# Patient Record
Sex: Male | Born: 2000 | Hispanic: No | Marital: Single | Smoking: Current some day smoker
Health system: Southern US, Community
[De-identification: ages and names within clinical notes are randomized; demographics above are authoritative.]

---
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
## 2020-01-25 ENCOUNTER — Encounter: Payer: Self-pay | Admitting: Pediatrics

## 2020-01-25 ENCOUNTER — Ambulatory Visit (INDEPENDENT_AMBULATORY_CARE_PROVIDER_SITE_OTHER): Payer: Medicaid Other | Admitting: Pediatrics

## 2020-01-25 ENCOUNTER — Other Ambulatory Visit: Payer: Self-pay

## 2020-01-25 VITALS — BP 124/83 | HR 73 | Ht 68.0 in | Wt 218.2 lb

## 2020-01-25 DIAGNOSIS — R05 Cough: Secondary | ICD-10-CM | POA: Diagnosis not present

## 2020-01-25 DIAGNOSIS — Z20822 Contact with and (suspected) exposure to covid-19: Secondary | ICD-10-CM

## 2020-01-25 DIAGNOSIS — J069 Acute upper respiratory infection, unspecified: Secondary | ICD-10-CM

## 2020-01-25 DIAGNOSIS — F17290 Nicotine dependence, other tobacco product, uncomplicated: Secondary | ICD-10-CM | POA: Diagnosis not present

## 2020-01-25 DIAGNOSIS — R059 Cough, unspecified: Secondary | ICD-10-CM

## 2020-01-25 DIAGNOSIS — F17200 Nicotine dependence, unspecified, uncomplicated: Secondary | ICD-10-CM

## 2020-01-25 DIAGNOSIS — Z03818 Encounter for observation for suspected exposure to other biological agents ruled out: Secondary | ICD-10-CM

## 2020-01-25 DIAGNOSIS — R112 Nausea with vomiting, unspecified: Secondary | ICD-10-CM | POA: Diagnosis not present

## 2020-01-25 DIAGNOSIS — J029 Acute pharyngitis, unspecified: Secondary | ICD-10-CM | POA: Diagnosis not present

## 2020-01-25 LAB — POC SOFIA SARS ANTIGEN FIA: SARS:: NEGATIVE

## 2020-01-25 LAB — POCT RAPID STREP A (OFFICE): Rapid Strep A Screen: NEGATIVE

## 2020-01-25 NOTE — Progress Notes (Signed)
Name: Justin Yoder Age: 19 y.o. Sex: male DOB: November 17, 2000 MRN: [id number] Date of office visit: 01/25/2020  Chief Complaint  Patient presents with  . Emesis  . Sore Throat  . Cough    accompanied by mom Inetta Fermo, who is the primary historian.    HPI:  This is a 19 y.o. old patient who presents with a sore throat which started when he woke up two days ago.  This was followed by nonbilious, nonbloody vomiting the following day. He vomited once in the morning, then three more times throughout the day.  He also has associated symptoms of nasal congestion and a severe, productive cough which started yesterday. He denies any abdominal pain, diarrhea, fever, or constipation.  He has two siblings who presented with similar symptoms two weeks ago.  The patient smokes cigars.  History reviewed. No pertinent past medical history.  History reviewed. No pertinent surgical history.   History reviewed. No pertinent family history.  Outpatient Encounter Medications as of 01/25/2020  Medication Sig  . [DISCONTINUED] acetaminophen (TYLENOL 8 HOUR) 650 MG CR tablet Take 1 tablet (650 mg total) by mouth every 8 (eight) hours.  . [DISCONTINUED] fluticasone (FLONASE) 50 MCG/ACT nasal spray Place 2 sprays into both nostrils daily.  . [DISCONTINUED] Lidocaine 4 % PTCH Apply 1 patch topically 2 (two) times daily.  . [DISCONTINUED] methocarbamol (ROBAXIN) 500 MG tablet Take 1 tablet (500 mg total) by mouth 2 (two) times daily.  . [DISCONTINUED] naproxen (NAPROSYN) 500 MG tablet Take 1 tablet (500 mg total) by mouth 2 (two) times daily.   No facility-administered encounter medications on file as of 01/25/2020.     ALLERGIES:  No Known Allergies    OBJECTIVE:  VITALS: Blood pressure 124/83, pulse 73, height 5\' 8"  (1.727 m), weight 218 lb 3.2 oz (99 kg), SpO2 99 %.   Body mass index is 33.18 kg/m.  98 %ile (Z= 2.06) based on CDC (Boys, 2-20 Years) BMI-for-age based on BMI available as of  01/25/2020.  Wt Readings from Last 3 Encounters:  01/25/20 218 lb 3.2 oz (99 kg) (97 %, Z= 1.85)*  11/27/18 210 lb (95.3 kg) (96 %, Z= 1.78)*  07/16/17 185 lb (83.9 kg) (92 %, Z= 1.43)*   * Growth percentiles are based on CDC (Boys, 2-20 Years) data.   Ht Readings from Last 3 Encounters:  01/25/20 5\' 8"  (1.727 m) (29 %, Z= -0.56)*  11/27/18 5\' 10"  (1.778 m) (58 %, Z= 0.21)*  07/16/17 5\' 9"  (1.753 m) (51 %, Z= 0.02)*   * Growth percentiles are based on CDC (Boys, 2-20 Years) data.     PHYSICAL EXAM:  General: The patient appears awake, alert, and in no acute distress.  Head: Head is atraumatic/normocephalic.  Ears: TMs are translucent bilaterally without erythema or bulging.  Eyes: No scleral icterus.  No conjunctival injection.  Nose: Nasal congestion is present with clear rhinorrhea noted.  Mouth/Throat: Mouth is moist.  Throat with erythema over the palatoglossal arches.  Neck: Supple without adenopathy.  Chest: Good expansion, symmetric, no deformities noted.  Heart: Regular rate with normal S1-S2.  Lungs: Lungs are coarse but clear to auscultation bilaterally without wheezes or crackles.  No respiratory distress, work of breathing, or tachypnea noted.  Abdomen: Soft, nontender, nondistended with normal active bowel sounds.   No masses palpated.  No organomegaly noted.  Skin: No rashes noted.  Extremities/Back: Full range of motion with no deficits noted.  Neurologic exam: Musculoskeletal exam appropriate for age, normal strength,  and tone.   IN-HOUSE LABORATORY RESULTS: Results for orders placed or performed in visit on 01/25/20  POC SOFIA Antigen FIA  Result Value Ref Range   SARS: Negative Negative  POCT rapid strep A  Result Value Ref Range   Rapid Strep A Screen Negative Negative     ASSESSMENT/PLAN:  1. Viral pharyngitis Patient has a sore throat caused by virus. The patient will be contagious for the next several days. Soft mechanical diet may be  instituted. This includes things from dairy including milkshakes, ice cream, and cold milk. Push fluids. Any problems call back or return to office. Tylenol or Motrin may be used as needed for pain or fever per directions on the bottle. Rest is critically important to enhance the healing process and is encouraged by limiting activities.  - POCT rapid strep A  2. Viral upper respiratory infection Discussed this patient has a viral upper respiratory infection.  Nasal saline may be used for congestion and to thin the secretions for easier mobilization of the secretions. A humidifier may be used. Increase the amount of fluids the child is taking in to improve hydration. Tylenol may be used as directed on the bottle. Rest is critically important to enhance the healing process and is encouraged by limiting activities.  - POC SOFIA Antigen FIA  3. Cough Cough is a protective mechanism to clear airway secretions. Do not suppress a productive cough.  Increasing fluid intake will help keep the patient hydrated, therefore making the cough more productive and subsequently helpful. Running a humidifier helps increase water in the environment also making the cough more productive. If the child develops respiratory distress, increased work of breathing, retractions(sucking in the ribs to breathe), or increased respiratory rate, return to the office or ER.  4. Non-intractable vomiting with nausea, unspecified vomiting type Discussed vomiting is a nonspecific symptom that may have many different causes.  This 19-year-old's cause may be viral or many other causes. Discussed about small quantities of fluids frequently (ORT).  Avoid red beverages, juice, Powerade, Pedialyte, and caffeine.  Gatorade, water, or milk may be given.  Monitor urine output for hydration status.  If the child develops dehydration, return to office or ER.  5. Smoking Discussed with the patient about smoking.  Discussed about the side effects and  problems with smoking, particularly smoking cigars.  Counseling was performed x10 minutes.  6. Lab test negative for COVID-19 virus Discussed this patient has tested negative for COVID-19.  However, discussed about testing done and the limitations of the testing.  The testing done in this office is a FIA antigen test, not PCR.  The specificity is 100%, but the sensitivity is 95.2%.  Thus, there is no guarantee patient does not have Covid because lab tests can be incorrect.  Patient should be monitored closely and if the symptoms worsen or become severe, medical attention should be sought for the patient to be reevaluated.    Results for orders placed or performed in visit on 01/25/20  POC SOFIA Antigen FIA  Result Value Ref Range   SARS: Negative Negative  POCT rapid strep A  Result Value Ref Range   Rapid Strep A Screen Negative Negative      Return if symptoms worsen or fail to improve.

## 2020-03-01 ENCOUNTER — Other Ambulatory Visit: Payer: Medicaid Other

## 2020-03-01 DIAGNOSIS — Z20822 Contact with and (suspected) exposure to covid-19: Secondary | ICD-10-CM

## 2020-03-02 LAB — NOVEL CORONAVIRUS, NAA: SARS-CoV-2, NAA: NOT DETECTED

## 2020-03-02 LAB — SARS-COV-2, NAA 2 DAY TAT

## 2020-03-03 ENCOUNTER — Telehealth: Payer: Self-pay

## 2020-03-03 NOTE — Telephone Encounter (Signed)
  Called and informed patient that test for Covid 19 was NEGATIVE. Discussed signs and symptoms of Covid 19 : fever, chills, respiratory symptoms, cough, ENT symptoms, sore throat, SOB, muscle pain, diarrhea, headache, loss of taste/smell, close exposure to COVID-19 patient. Pt instructed to call PCP if they develop the above signs and sx. Pt also instructed to call 911 if having respiratory issues/distress.  Pt verbalized understanding. Spoke with pt's guardian.

## 2020-04-06 IMAGING — DX LUMBAR SPINE - COMPLETE 4+ VIEW
5 series · 5 of 5 positions shown · non-contrast
Comparison: None.

CLINICAL DATA: Fall.  Back pain

EXAM:
LUMBAR SPINE - COMPLETE 4+ VIEW

[l-spine ap]
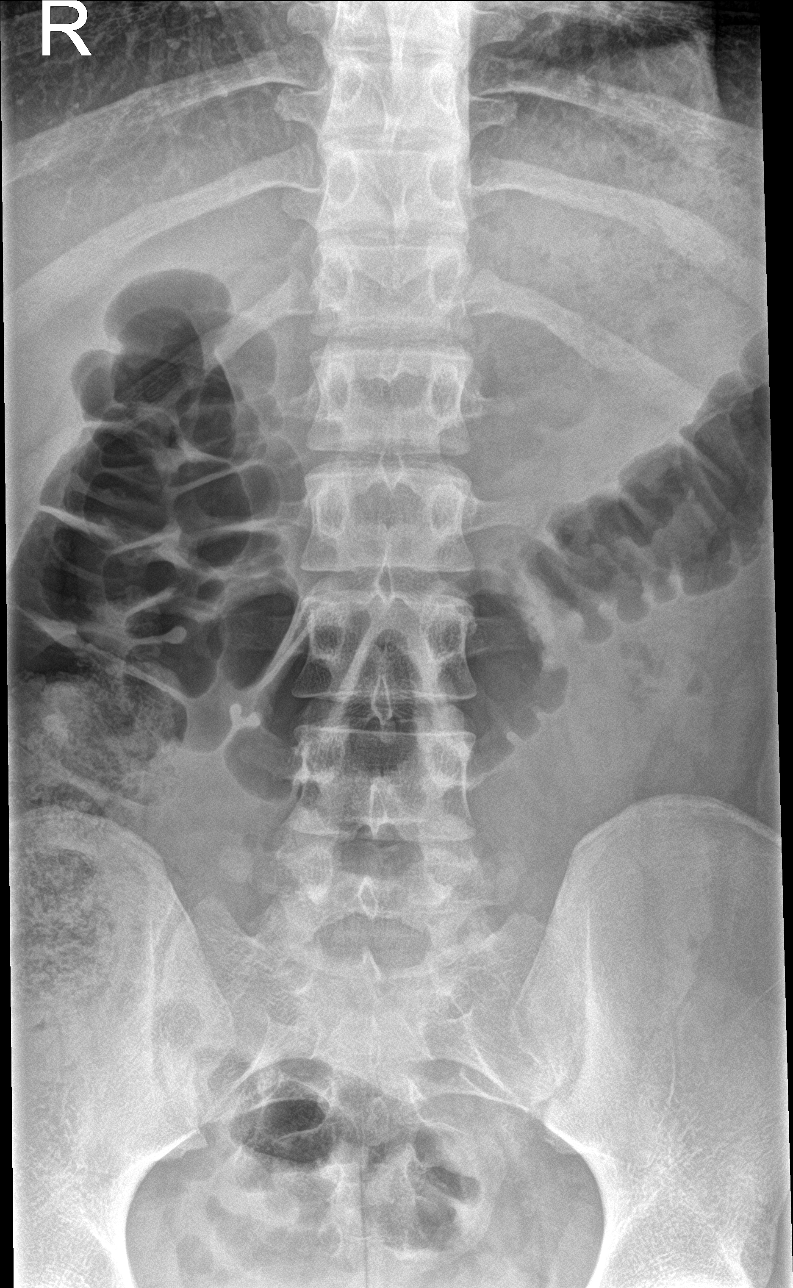

[l-spine obl (1 of 2)]
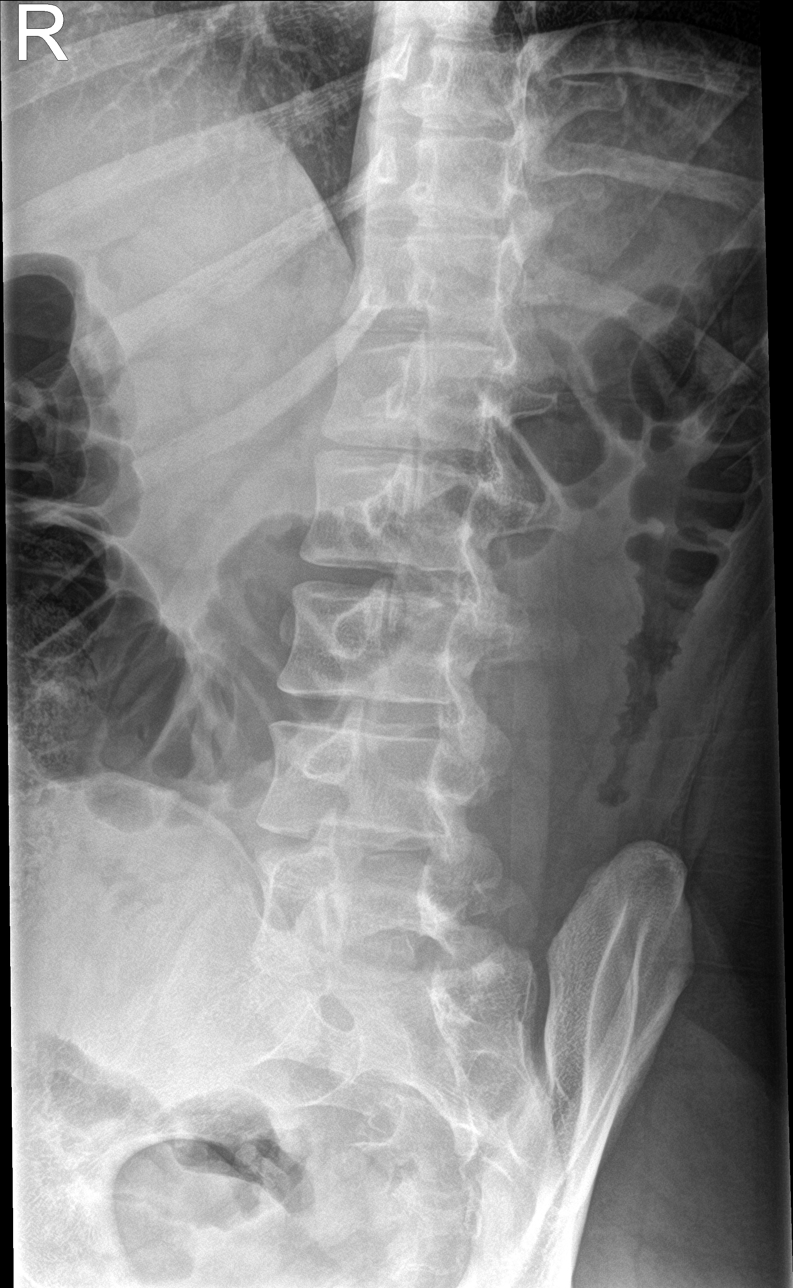

[l-spine obl (2 of 2)]
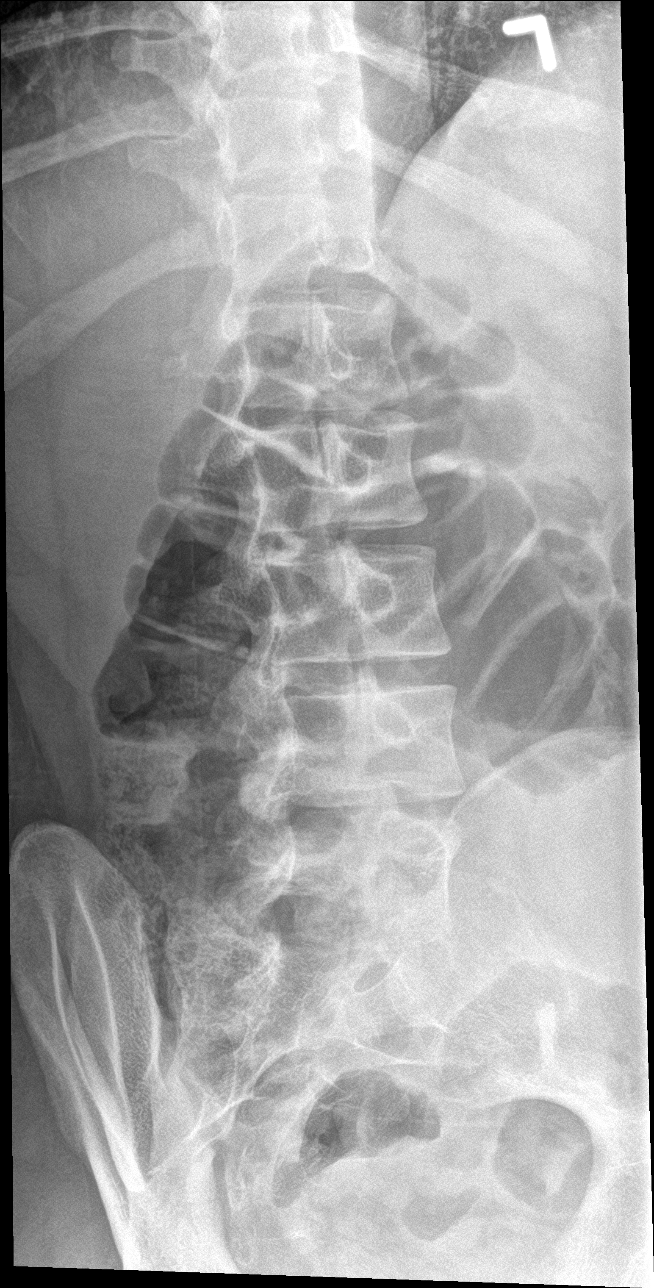

[l-spine lat]
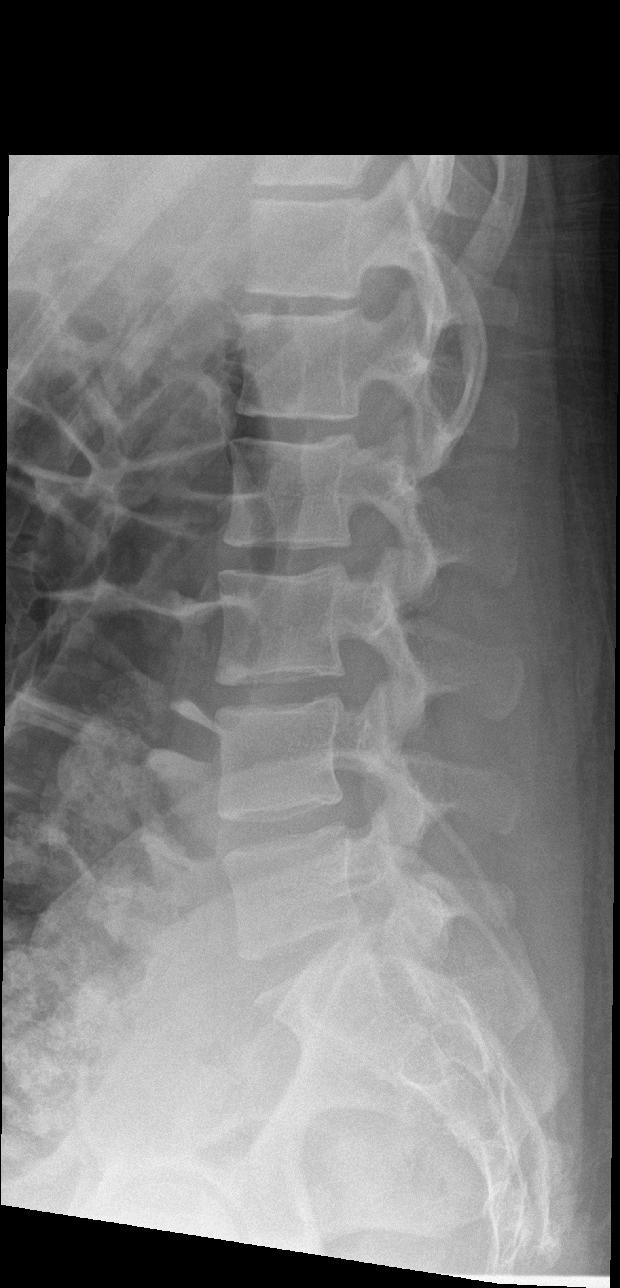

[l-spine spot]
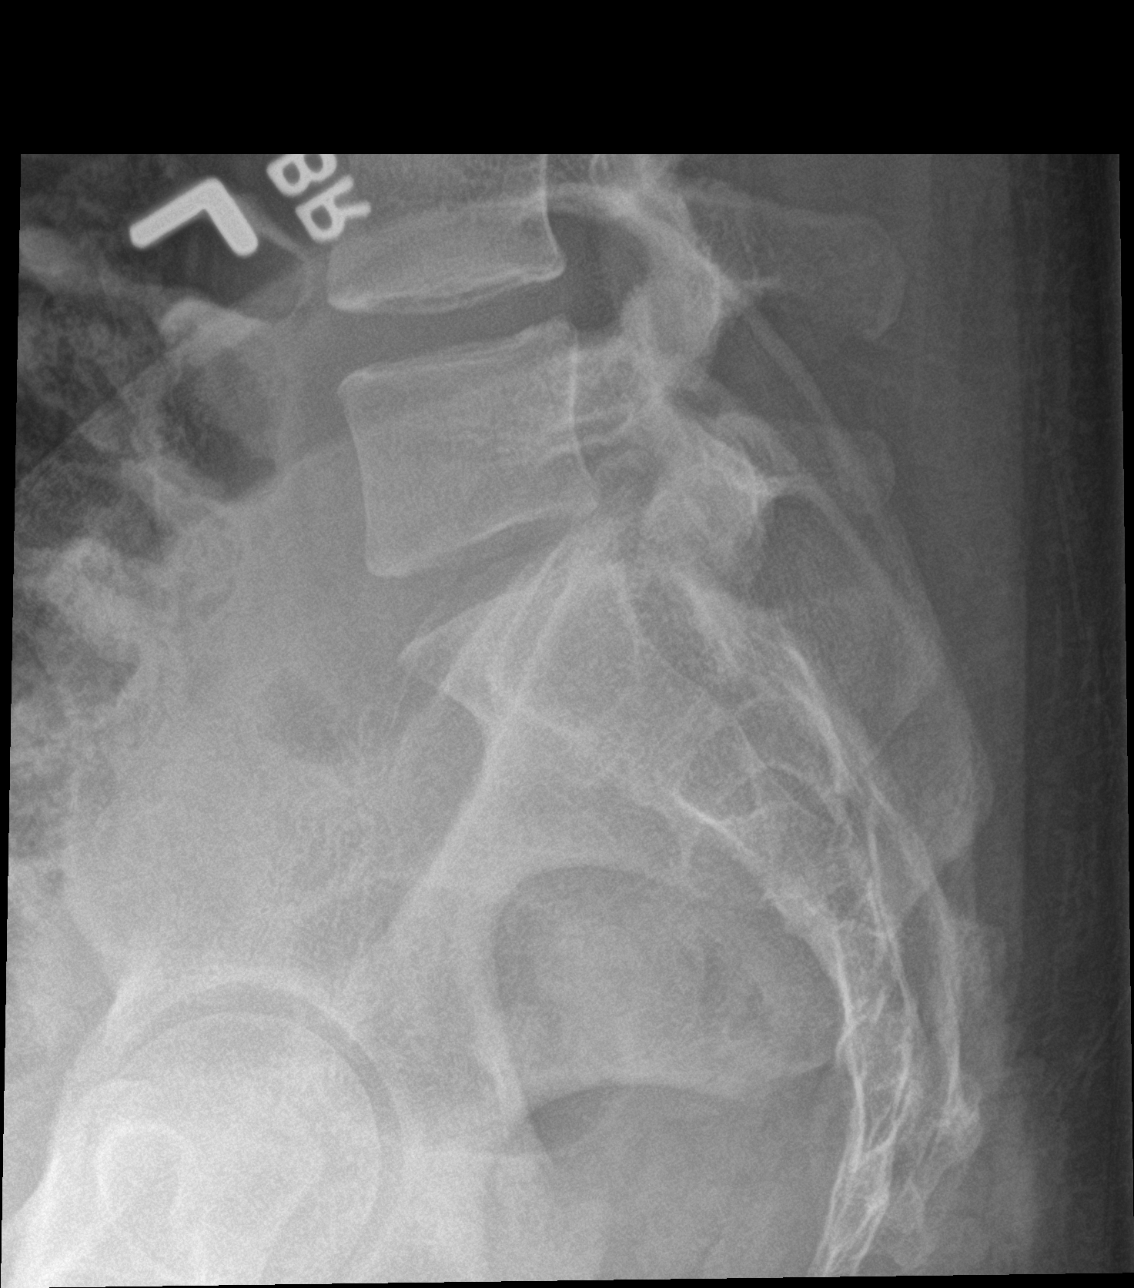

[5 of 5 positions shown; findings below may reference images not displayed]

FINDINGS: Normal alignment of the lumbar spine. The vertebral body heights are
well preserved. There is a deformity involving the right transverse
process of the L3 vertebra noted suspicious for fracture. No
additional bone abnormalities noted.
IMPRESSION: Suspect right L3 transverse process fracture

## 2020-05-09 ENCOUNTER — Ambulatory Visit (INDEPENDENT_AMBULATORY_CARE_PROVIDER_SITE_OTHER): Payer: Medicaid Other | Admitting: Pediatrics

## 2020-05-09 ENCOUNTER — Encounter: Payer: Self-pay | Admitting: Pediatrics

## 2020-05-09 ENCOUNTER — Other Ambulatory Visit: Payer: Self-pay

## 2020-05-09 VITALS — BP 127/81 | HR 83 | Ht 68.19 in | Wt 208.0 lb

## 2020-05-09 DIAGNOSIS — J069 Acute upper respiratory infection, unspecified: Secondary | ICD-10-CM | POA: Diagnosis not present

## 2020-05-09 DIAGNOSIS — R059 Cough, unspecified: Secondary | ICD-10-CM

## 2020-05-09 DIAGNOSIS — Z20822 Contact with and (suspected) exposure to covid-19: Secondary | ICD-10-CM

## 2020-05-09 DIAGNOSIS — J029 Acute pharyngitis, unspecified: Secondary | ICD-10-CM | POA: Diagnosis not present

## 2020-05-09 LAB — POCT RAPID STREP A (OFFICE): Rapid Strep A Screen: NEGATIVE

## 2020-05-09 LAB — POCT INFLUENZA A: Rapid Influenza A Ag: NEGATIVE

## 2020-05-09 LAB — POCT INFLUENZA B: Rapid Influenza B Ag: NEGATIVE

## 2020-05-09 LAB — POC SOFIA SARS ANTIGEN FIA: SARS:: NEGATIVE

## 2020-05-09 NOTE — Progress Notes (Signed)
Name: Justin Yoder Age: 20 y.o. Sex: male DOB: 2000-12-17 MRN: [id number] Date of office visit: 05/09/2020  Chief Complaint  Patient presents with  . Nasal Congestion  . Sore Throat    Accompanied by Inetta Fermo, who is the primary historian.    HPI:  This is a 20 y.o. old patient who presents with gradual onset of headache, productive cough, and nasal congestion, all which started on Sunday when he woke up. Patient's cough is productive with clear sputum. The patient developed sore throat on Monday.  Mom denies the patient has had fever, dizziness, vomiting, or diarrhea.   History reviewed. No pertinent past medical history.  History reviewed. No pertinent surgical history.   History reviewed. No pertinent family history.  No outpatient encounter medications on file as of 05/09/2020.   No facility-administered encounter medications on file as of 05/09/2020.     ALLERGIES:  No Known Allergies   OBJECTIVE:  VITALS: Blood pressure 127/81, pulse 83, height 5' 8.19" (1.732 m), weight 208 lb (94.3 kg), SpO2 97 %.   Body mass index is 31.45 kg/m.  96 %ile (Z= 1.81) based on CDC (Boys, 2-20 Years) BMI-for-age based on BMI available as of 05/09/2020.  Wt Readings from Last 3 Encounters:  05/09/20 208 lb (94.3 kg) (95 %, Z= 1.61)*  01/25/20 218 lb 3.2 oz (99 kg) (97 %, Z= 1.85)*  11/27/18 210 lb (95.3 kg) (96 %, Z= 1.78)*   * Growth percentiles are based on CDC (Boys, 2-20 Years) data.   Ht Readings from Last 3 Encounters:  05/09/20 5' 8.19" (1.732 m) (31 %, Z= -0.50)*  01/25/20 5\' 8"  (1.727 m) (29 %, Z= -0.56)*  11/27/18 5\' 10"  (1.778 m) (58 %, Z= 0.21)*   * Growth percentiles are based on CDC (Boys, 2-20 Years) data.     PHYSICAL EXAM:  General: The patient appears awake, alert, and in no acute distress.  Head: Head is atraumatic/normocephalic.  Ears: TMs are translucent bilaterally without erythema or bulging.  Eyes: No scleral icterus.  No conjunctival  injection.  Nose: Nasal congestion is present with crusted coryza injected turbinates.  Mouth/Throat: Mouth is moist.  Throat with erythema over the palatoglossal arches bilaterally.  Neck: Supple without adenopathy.  Chest: Good expansion, symmetric, no deformities noted.  Heart: Regular rate with normal S1-S2.  Lungs: Clear to auscultation bilaterally without wheezes or crackles.  No respiratory distress, work of breathing, or tachypnea noted.  Abdomen: Soft, nontender, nondistended with normal active bowel sounds.   No masses palpated.  No organomegaly noted.  Skin: No rashes noted.  Extremities/Back: Full range of motion with no deficits noted.  Neurologic exam: Musculoskeletal exam appropriate for age, normal strength, and tone.   IN-HOUSE LABORATORY RESULTS: Results for orders placed or performed in visit on 05/09/20  POC SOFIA Antigen FIA  Result Value Ref Range   SARS: Negative Negative  POCT Influenza A  Result Value Ref Range   Rapid Influenza A Ag negative   POCT Influenza B  Result Value Ref Range   Rapid Influenza B Ag negative   POCT rapid strep A  Result Value Ref Range   Rapid Strep A Screen Negative Negative     ASSESSMENT/PLAN:  1. Viral pharyngitis Patient has a sore throat caused by a virus. The patient will be contagious for the next several days. Soft mechanical diet may be instituted. This includes things from dairy including milkshakes, ice cream, and cold milk. Push fluids. Any problems call back or  return to office. Tylenol or Motrin may be used as needed for pain or fever per directions on the bottle. Rest is critically important to enhance the healing process and is encouraged by limiting activities.  - POCT rapid strep A  2. Viral URI Discussed this patient has a viral upper respiratory infection.  Nasal saline may be used for congestion and to thin the secretions for easier mobilization of the secretions. A humidifier may be used. Increase  the amount of fluids the child is taking in to improve hydration. Tylenol may be used as directed on the bottle. Rest is critically important to enhance the healing process and is encouraged by limiting activities.  - POC SOFIA Antigen FIA - POCT Influenza A - POCT Influenza B  3. Cough Cough is a protective mechanism to clear airway secretions. Do not suppress a productive cough.  Increasing fluid intake will help keep the patient hydrated, therefore making the cough more productive and subsequently helpful. Running a humidifier helps increase water in the environment also making the cough more productive. If the child develops respiratory distress, increased work of breathing, retractions(sucking in the ribs to breathe), or increased respiratory rate, return to the office or ER.  4. Lab test negative for COVID-19 virus Discussed this patient has tested negative for COVID-19.  However, discussed about testing done and the limitations of the testing.  The testing done in this office is a FIA antigen test, not PCR.  The specificity is 100%, but the sensitivity is 95.2%.  Thus, there is no guarantee patient does not have Covid because lab tests can be incorrect.  Patient should be monitored closely and if the symptoms worsen or become severe, medical attention should be sought for the patient to be reevaluated.   Results for orders placed or performed in visit on 05/09/20  POC SOFIA Antigen FIA  Result Value Ref Range   SARS: Negative Negative  POCT Influenza A  Result Value Ref Range   Rapid Influenza A Ag negative   POCT Influenza B  Result Value Ref Range   Rapid Influenza B Ag negative   POCT rapid strep A  Result Value Ref Range   Rapid Strep A Screen Negative Negative      Return if symptoms worsen or fail to improve.

## 2020-07-26 ENCOUNTER — Encounter: Payer: Self-pay | Admitting: Pediatrics

## 2021-05-08 ENCOUNTER — Encounter (HOSPITAL_COMMUNITY): Payer: Self-pay

## 2021-05-08 ENCOUNTER — Emergency Department (HOSPITAL_COMMUNITY)
Admission: EM | Admit: 2021-05-08 | Discharge: 2021-05-08 | Disposition: A | Discharge status: Home / Self Care | Payer: Medicaid Other | Attending: Emergency Medicine | Admitting: Emergency Medicine

## 2021-05-08 ENCOUNTER — Other Ambulatory Visit: Payer: Self-pay

## 2021-05-08 DIAGNOSIS — Z0000 Encounter for general adult medical examination without abnormal findings: Secondary | ICD-10-CM | POA: Insufficient documentation

## 2021-05-08 DIAGNOSIS — N5089 Other specified disorders of the male genital organs: Secondary | ICD-10-CM | POA: Diagnosis not present

## 2021-05-08 DIAGNOSIS — Z139 Encounter for screening, unspecified: Secondary | ICD-10-CM

## 2021-05-08 NOTE — Discharge Instructions (Signed)
Please follow-up with your primary care provider or return to the emergency department if your symptoms recur.

## 2021-05-08 NOTE — ED Provider Notes (Signed)
[facility] EMERGENCY DEPARTMENT Provider Note   CSN: [id number] Arrival date & time: 05/08/21  1013     History  Chief Complaint  Patient presents with   Groin Swelling    Justin Yoder is a 21 y.o. male.  HPI     Dementrius Orzel is a 21 y.o. male who presents to the Emergency Department requesting evaluation for a lump of his left testicle that he noticed last night while showering.  He describes feeling a dime sized area of swelling behind his left testicle.  Area was associated with mild discomfort with palpation that has since resolved.  He denies fever, chills, dysuria, abdominal pain, swelling of his testicle or penis.  No discharge, no injury   Home Medications Prior to Admission medications   Not on File      Allergies    Patient has no known allergies.    Review of Systems   Review of Systems  Constitutional:  Negative for chills and fever.  Gastrointestinal:  Negative for abdominal pain, nausea, rectal pain and vomiting.  Genitourinary:  Negative for decreased urine volume, difficulty urinating, dysuria, penile discharge, penile pain and scrotal swelling.       Testicle mass  Skin:  Negative for rash and wound.  All other systems reviewed and are negative.  Physical Exam Updated Vital Signs BP 127/81 (BP Location: Right Arm)    Pulse 71    Temp 98.4 F (36.9 C) (Oral)    Resp 16    SpO2 99%  Physical Exam Vitals and nursing note reviewed. Exam conducted with a chaperone present.  Constitutional:      Appearance: Normal appearance. He is not ill-appearing or toxic-appearing.  Cardiovascular:     Rate and Rhythm: Normal rate and regular rhythm.     Pulses: Normal pulses.  Abdominal:     Palpations: Abdomen is soft.     Tenderness: There is no abdominal tenderness.     Hernia: There is no hernia in the left inguinal area or right inguinal area.  Genitourinary:    Penis: Normal. No tenderness or swelling.      Epididymis:     Right: No tenderness.      Left: Not enlarged. No mass or tenderness.     Comments: Left testicle is soft, non tender.  No palpable masses, no edema or erythema Musculoskeletal:        General: Normal range of motion.  Skin:    General: Skin is warm.     Findings: No bruising, erythema, lesion or rash.  Neurological:     General: No focal deficit present.     Mental Status: He is alert.     Sensory: No sensory deficit.     Motor: No weakness.    ED Results / Procedures / Treatments   Labs (all labs ordered are listed, but only abnormal results are displayed) Labs Reviewed - No data to display  EKG None  Radiology No results found.  Procedures Procedures    Medications Ordered in ED Medications - No data to display  ED Course/ Medical Decision Making/ A&P                           Medical Decision Making  Pt here for evaluation of left testicle tenderness and mass that he noticed one day prior to arrival.  No symptoms at present  On exam, there is no tenderness or palpable masses of the left testicle.  No inguinal hernias.  Pt also unable to palpate any masses or reproduce pain at this time.   Doubt emergent process given absence of sx's.  Discussed return precautions and pt agreeable to plan         Final Clinical Impression(s) / ED Diagnoses Final diagnoses:  Encounter for medical screening examination    Rx / DC Orders ED Discharge Orders     None         Kem Parkinson, PA-C 05/10/21 2313    Milton Ferguson, MD 05/11/21 1013

## 2021-05-08 NOTE — ED Triage Notes (Signed)
Patient complaining of lump left on left testicle that he noticed when in the shower. Denies pain unless touching it.

## 2021-08-02 ENCOUNTER — Telehealth: Payer: Self-pay

## 2021-08-02 NOTE — Telephone Encounter (Signed)
Transition Care Management Follow-up Telephone Call ?Date of discharge and from where: 08/01/2021-[facility] ?How have you been since you were released from the hospital? Pt stated he is doing fine. ?Any questions or concerns? No ? ?Items Reviewed: ?Did the pt receive and understand the discharge instructions provided? Yes  ?Medications obtained and verified?  No medications sent to pharmacy.  ?Other? No  ?Any new allergies since your discharge? No  ?Dietary orders reviewed? No ?Do you have support at home? Yes  ? ?Home Care and Equipment/Supplies: ?Were home health services ordered? not applicable ?If so, what is the name of the agency? N/A  ?Has the agency set up a time to come to the patient's home? not applicable ?Were any new equipment or medical supplies ordered?  No ?What is the name of the medical supply agency? N/A ?Were you able to get the supplies/equipment? not applicable ?Do you have any questions related to the use of the equipment or supplies? No ? ?Functional Questionnaire: (I = Independent and D = Dependent) ?ADLs: I ? ?Bathing/Dressing- I ? ?Meal Prep- I ? ?Eating- I ? ?Maintaining continence- I ? ?Transferring/Ambulation- I ? ?Managing Meds- I ? ?Follow up appointments reviewed: ? ?PCP Hospital f/u appt confirmed? No  Specialist Hospital f/u appt confirmed? No   ?Are transportation arrangements needed? No  ?If their condition worsens, is the pt aware to call PCP or go to the Emergency Dept.? Yes ?Was the patient provided with contact information for the PCP's office or ED? Yes ?Was to pt encouraged to call back with questions or concerns? Yes  ?
